# Patient Record
Sex: Male | Born: 1960 | Race: White | Hispanic: No | Smoking: Current every day smoker
Health system: Southern US, Community
[De-identification: ages and names within clinical notes are randomized; demographics above are authoritative.]

## PROBLEM LIST (undated history)

## (undated) DIAGNOSIS — M069 Rheumatoid arthritis, unspecified: Secondary | ICD-10-CM

---

## 2006-04-15 ENCOUNTER — Emergency Department: Payer: Self-pay | Admitting: Emergency Medicine

## 2009-11-25 ENCOUNTER — Emergency Department: Payer: Self-pay | Admitting: Emergency Medicine

## 2014-11-21 ENCOUNTER — Other Ambulatory Visit: Payer: Self-pay

## 2014-11-21 ENCOUNTER — Encounter: Payer: Self-pay | Admitting: Emergency Medicine

## 2014-11-21 ENCOUNTER — Emergency Department
Admission: EM | Admit: 2014-11-21 | Discharge: 2014-11-21 | Disposition: A | Discharge status: Home / Self Care | Payer: BLUE CROSS/BLUE SHIELD | Attending: Emergency Medicine | Admitting: Emergency Medicine

## 2014-11-21 DIAGNOSIS — Z880 Allergy status to penicillin: Secondary | ICD-10-CM | POA: Diagnosis not present

## 2014-11-21 DIAGNOSIS — R319 Hematuria, unspecified: Secondary | ICD-10-CM | POA: Insufficient documentation

## 2014-11-21 DIAGNOSIS — Z87891 Personal history of nicotine dependence: Secondary | ICD-10-CM | POA: Diagnosis not present

## 2014-11-21 DIAGNOSIS — R110 Nausea: Secondary | ICD-10-CM | POA: Diagnosis present

## 2014-11-21 DIAGNOSIS — Z79899 Other long term (current) drug therapy: Secondary | ICD-10-CM | POA: Insufficient documentation

## 2014-11-21 HISTORY — DX: Rheumatoid arthritis, unspecified: M06.9

## 2014-11-21 LAB — URINALYSIS COMPLETE WITH MICROSCOPIC (ARMC ONLY)
BILIRUBIN URINE: NEGATIVE
Bacteria, UA: NONE SEEN
Glucose, UA: NEGATIVE mg/dL
Ketones, ur: NEGATIVE mg/dL
LEUKOCYTES UA: NEGATIVE
NITRITE: NEGATIVE
PROTEIN: NEGATIVE mg/dL
SPECIFIC GRAVITY, URINE: 1.024 (ref 1.005–1.030)
pH: 7 (ref 5.0–8.0)

## 2014-11-21 LAB — HEPATIC FUNCTION PANEL
ALK PHOS: 96 U/L (ref 38–126)
ALT: 14 U/L — ABNORMAL LOW (ref 17–63)
AST: 22 U/L (ref 15–41)
Albumin: 3.1 g/dL — ABNORMAL LOW (ref 3.5–5.0)
Bilirubin, Direct: <0.1 mg/dL — ABNORMAL LOW (ref 0.1–0.5)
Total Bilirubin: 0.3 mg/dL (ref 0.3–1.2)
Total Protein: 7.7 g/dL (ref 6.5–8.1)

## 2014-11-21 LAB — BASIC METABOLIC PANEL
Anion gap: 10 (ref 5–15)
BUN: 13 mg/dL (ref 6–20)
CO2: 22 mmol/L (ref 22–32)
CREATININE: 0.74 mg/dL (ref 0.61–1.24)
Calcium: 9 mg/dL (ref 8.9–10.3)
Chloride: 103 mmol/L (ref 101–111)
GFR calc non Af Amer: >60 mL/min (ref >60)
Glucose, Bld: 117 mg/dL — ABNORMAL HIGH (ref 65–99)
Potassium: 3.6 mmol/L (ref 3.5–5.1)
Sodium: 135 mmol/L (ref 135–145)

## 2014-11-21 LAB — TROPONIN I

## 2014-11-21 LAB — CBC
HCT: 36.2 % — ABNORMAL LOW (ref 40.0–52.0)
HEMOGLOBIN: 11.7 g/dL — AB (ref 13.0–18.0)
MCH: 26.8 pg (ref 26.0–34.0)
MCHC: 32.5 g/dL (ref 32.0–36.0)
MCV: 82.6 fL (ref 80.0–100.0)
PLATELETS: 336 10*3/uL (ref 150–440)
RBC: 4.38 MIL/uL — AB (ref 4.40–5.90)
RDW: 16.2 % — ABNORMAL HIGH (ref 11.5–14.5)
WBC: 6.6 10*3/uL (ref 3.8–10.6)

## 2014-11-21 LAB — CK: Total CK: 44 U/L — ABNORMAL LOW (ref 49–397)

## 2014-11-21 MED ORDER — SODIUM CHLORIDE 0.9 % IV SOLN
Freq: Once | INTRAVENOUS | Status: AC
  Administered 2014-11-21: 16:00:00 via INTRAVENOUS

## 2014-11-21 NOTE — ED Notes (Signed)
Pt comes into ED c/o dizziness and nausea.  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  Denies vomiting, diarrhea, fevers, chills.

## 2014-11-21 NOTE — Discharge Instructions (Signed)
Hematuria Hematuria is blood in your urine. It can be caused by a bladder infection, kidney infection, prostate infection, kidney stone, or cancer of your urinary tract. Infections can usually be treated with medicine, and a kidney stone usually will pass through your urine. If neither of these is the cause of your hematuria, further workup to find out the reason may be needed. It is very important that you tell your health care provider about any blood you see in your urine, even if the blood stops without treatment or happens without causing pain. Blood in your urine that happens and then stops and then happens again can be a symptom of a very serious condition. Also, pain is not a symptom in the initial stages of many urinary cancers. HOME CARE INSTRUCTIONS   Drink lots of fluid, 3-4 quarts a day. If you have been diagnosed with an infection, cranberry juice is especially recommended, in addition to large amounts of water.  Avoid caffeine, tea, and carbonated beverages because they tend to irritate the bladder.  Avoid alcohol because it may irritate the prostate.  Take all medicines as directed by your health care provider.  If you were prescribed an antibiotic medicine, finish it all even if you start to feel better.  If you have been diagnosed with a kidney stone, follow your health care provider's instructions regarding straining your urine to catch the stone.  Empty your bladder often. Avoid holding urine for long periods of time.  After a bowel movement, women should cleanse front to back. Use each tissue only once.  Empty your bladder before and after sexual intercourse if you are a male. SEEK MEDICAL CARE IF:  You develop back pain.  You have a fever.  You have a feeling of sickness in your stomach (nausea) or vomiting.  Your symptoms are not better in 3 days. Return sooner if you are getting worse. SEEK IMMEDIATE MEDICAL CARE IF:   You develop severe vomiting and are  unable to keep the medicine down.  You develop severe back or abdominal pain despite taking your medicines.  You begin passing a large amount of blood or clots in your urine.  You feel extremely weak or faint, or you pass out. MAKE SURE YOU:   Understand these instructions.  Will watch your condition.  Will get help right away if you are not doing well or get worse. Document Released: 04/14/2005 Document Revised: 08/29/2013 Document Reviewed: 12/13/2012 [facility] Patient Information 2015 [place], [place]. This information is not intended to replace advice given to you by your health care provider. Make sure you discuss any questions you have with your health care provider.   THE URINE SPECIMEN SHOWED SOME BLOOD IN IT. PLEASE HAVE YOUR DOCTOR REFER YOU TO A UROLOGIST TO FOLLOW UP ON THAT OR SEE DR WOLFF THE UROLOGIST ON CALL HERE. PLEASE AWSK YOUR DOCTOR IF HE CHECKED YOUR THYROID FUNCTIONS TOO. LAST PLEASE FOLLOW UP WITH THE CARDIOLOGIST DR Welton Flakes IS ON CALL HERE.

## 2014-11-21 NOTE — ED Provider Notes (Signed)
[facility] Emergency Department Provider Note  ____________________________________________  Time seen: Approximately 3:15 PM  I have reviewed the triage vital signs and the nursing notes.   HISTORY  Chief Complaint Dizziness and Nausea    HPI Brendan Mueller is a 54 y.o. 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 He has been feeling lightheaded and nauseated since Saturday. He's been sweating a lot. He's also been having night sweats at home and in the air conditioning. He reports a slight cough. He denies knowing that he has a fever just the sweats at night. Denies any weight loss rash and body aches or pains or any other complaints. He reports several people in his work to come down with heat-related illnesses the last week. Patient has been waiting for several hours now but still really doesn't feel any better than it did when he walked in.  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 He reports they have his gout under good control. He has not had an attack of acute gout for many years.   Past Medical History  Diagnosis Date  . RA (rheumatoid arthritis)     There are no active problems to display for this patient.   History reviewed. No pertinent past surgical history.  Current Outpatient Rx  Name  Route  Sig  Dispense  Refill  . allopurinol (ZYLOPRIM) 300 MG tablet   Oral   Take 1 tablet by mouth daily.      6   . doxepin (SINEQUAN) 10 MG capsule   Oral   Take 1 capsule by mouth as needed.      3   . sertraline (ZOLOFT) 100 MG tablet   Oral   Take 100 mg by mouth daily.         . traMADol (ULTRAM) 50 MG tablet   Oral   Take 50 mg by mouth every 6 (six) hours as needed.         . triamcinolone cream (KENALOG) 0.1 %  Topical   Apply 1 application topically 2 (two) times daily as needed.      2     Allergies Penicillins  No family history on file.  Social History History  Substance Use Topics  . Smoking status: Former Games developer  . Smokeless tobacco: Not on file  . Alcohol Use: No    Review of Systems Constitutional: No fever/chills Eyes: No visual changes. ENT: No sore throat. Cardiovascular: Denies chest pain. Respiratory: Denies shortness of breath. Gastrointestinal: No abdominal pain.  No nausea,   No diarrhea.  No constipation. Genitourinary: Negative for dysuria. Musculoskeletal: Negative for back pain. Skin: Negative for rash. Neurological: Negative for headaches, focal weakness or numbness.  10-point ROS otherwise negative.  ____________________________________________   PHYSICAL EXAM:  VITAL SIGNS: ED Triage Vitals  Enc Vitals Group     BP 11/21/14 1317 139/87 mmHg     Pulse Rate 11/21/14 1317 72     Resp 11/21/14 1317 16     Temp 11/21/14 1317 98 F (36.7 C)     Temp Source 11/21/14 1317 Oral     SpO2 11/21/14 1317 97 %     Weight 11/21/14 1317 127 lb (57.607 kg)     Height 11/21/14  1317 5\' 11"  (1.803 m)     Head Cir --      Peak Flow --      Pain Score --      Pain Loc --      Pain Edu? --      Excl. in GC? --     Constitutional: Alert and oriented. Well appearing and in no acute distress. Eyes: Conjunctivae are normal. PERRL. EOMI. Head: Atraumatic. Nose: No congestion/rhinnorhea. Mouth/Throat: Mucous membranes are moist.  Oropharynx non-erythematous. Neck: No stridor. Cardiovascular: Normal rate, regular rhythm. Grossly normal heart sounds.  Good peripheral circulation. Respiratory: Normal respiratory effort.  No retractions. Lungs CTAB. Gastrointestinal: Soft and nontender. No distention. No abdominal bruits. No CVA tenderness. Musculoskeletal: No lower extremity tenderness nor edema.  No joint effusions. Patient does have getting tophi over his ulna  bilaterally reports also has the same ankles over the Achilles tendon bilaterally stable for him Neurologic:  Normal speech and language. No gross focal neurologic deficits are appreciated. No gait instability. Skin:  Skin is warm, dry and intact. No rash noted. Psychiatric: Mood and affect are normal. Speech and behavior are normal.  ____________________________________________   LABS (all labs ordered are listed, but only abnormal results are displayed)  Labs Reviewed  BASIC METABOLIC PANEL - Abnormal; Notable for the following:    Glucose, Bld 117 (*)    All other components within normal limits  CBC - Abnormal; Notable for the following:    RBC 4.38 (*)    Hemoglobin 11.7 (*)    HCT 36.2 (*)    RDW 16.2 (*)    All other components within normal limits  HEPATIC FUNCTION PANEL - Abnormal; Notable for the following:    Albumin 3.1 (*)    ALT 14 (*)    Bilirubin, Direct <0.1 (*)    All other components within normal limits  URINALYSIS COMPLETEWITH MICROSCOPIC ([facility] ONLY) - Abnormal; Notable for the following:    Color, Urine AMBER (*)    APPearance HAZY (*)    Hgb urine dipstick 2+ (*)    Squamous Epithelial / LPF 0-5 (*)    All other components within normal limits  CK - Abnormal; Notable for the following:    Total CK 44 (*)    All other components within normal limits  TROPONIN I  CBG MONITORING, ED   ____________________________________________  EKG EKG read and interpreted by me shows normal sinus rhythm at a rate of 77 normal axis. There are some T-wave flattening in V5 and slight inversion in V6. T waves in 1 and L and also very flat. ____________________________________________  RADIOLOGY      ____________________________________________   PROCEDURES    ____________________________________________   INITIAL IMPRESSION / ASSESSMENT AND PLAN / ED COURSE  Pertinent labs & imaging results that were available during my care of the patient were reviewed by  me and considered in my medical decision making (see chart for details).   ____________________________________________   FINAL CLINICAL IMPRESSION(S) / ED DIAGNOSES  Final diagnoses:  Nauseated  Hematuria      , MD 11/21/14 1958

## 2014-11-21 NOTE — ED Notes (Addendum)
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

## 2014-12-24 ENCOUNTER — Emergency Department
Admission: EM | Admit: 2014-12-24 | Discharge: 2014-12-24 | Disposition: A | Discharge status: Home / Self Care | Payer: BLUE CROSS/BLUE SHIELD | Attending: Emergency Medicine | Admitting: Emergency Medicine

## 2014-12-24 ENCOUNTER — Emergency Department: Payer: BLUE CROSS/BLUE SHIELD

## 2014-12-24 ENCOUNTER — Encounter: Payer: Self-pay | Admitting: Emergency Medicine

## 2014-12-24 DIAGNOSIS — Z79899 Other long term (current) drug therapy: Secondary | ICD-10-CM | POA: Insufficient documentation

## 2014-12-24 DIAGNOSIS — Y9289 Other specified places as the place of occurrence of the external cause: Secondary | ICD-10-CM | POA: Insufficient documentation

## 2014-12-24 DIAGNOSIS — Z7952 Long term (current) use of systemic steroids: Secondary | ICD-10-CM | POA: Insufficient documentation

## 2014-12-24 DIAGNOSIS — X58XXXA Exposure to other specified factors, initial encounter: Secondary | ICD-10-CM | POA: Insufficient documentation

## 2014-12-24 DIAGNOSIS — Y9389 Activity, other specified: Secondary | ICD-10-CM | POA: Insufficient documentation

## 2014-12-24 DIAGNOSIS — M069 Rheumatoid arthritis, unspecified: Secondary | ICD-10-CM | POA: Insufficient documentation

## 2014-12-24 DIAGNOSIS — Y998 Other external cause status: Secondary | ICD-10-CM | POA: Insufficient documentation

## 2014-12-24 DIAGNOSIS — Z720 Tobacco use: Secondary | ICD-10-CM | POA: Insufficient documentation

## 2014-12-24 DIAGNOSIS — S46912A Strain of unspecified muscle, fascia and tendon at shoulder and upper arm level, left arm, initial encounter: Secondary | ICD-10-CM | POA: Diagnosis not present

## 2014-12-24 DIAGNOSIS — S4992XA Unspecified injury of left shoulder and upper arm, initial encounter: Secondary | ICD-10-CM | POA: Diagnosis present

## 2014-12-24 DIAGNOSIS — Z880 Allergy status to penicillin: Secondary | ICD-10-CM | POA: Diagnosis not present

## 2014-12-24 MED ORDER — MELOXICAM 15 MG PO TABS
15.0000 mg | ORAL_TABLET | Freq: Every day | ORAL | Status: DC
Start: 2014-12-24 — End: 2015-01-21

## 2014-12-24 NOTE — ED Provider Notes (Signed)
[facility] Emergency Department Provider Note  ____________________________________________  Time seen: Approximately 5:10 PM  I have reviewed the triage vital signs and the nursing notes.   HISTORY  Chief Complaint Shoulder Pain   HPI Brendan Mueller is a 54 y.o. male is here complaining of left shoulder pain since Monday (6 days ago). Patient states that he was lifting an item with another person. He did not fall and nothing fell on him. He denies any previous injury to his shoulder. He states he told his boss that he was going to be seen on Friday if his pain continued. He has not taken anything other than tramadol for his pain. He denies any paresthesias. Range of motion increases his pain. He has not tried any ice or heat to the muscle area.   Past Medical History  Diagnosis Date  . RA (rheumatoid arthritis)     There are no active problems to display for this patient.   No past surgical history on file.  Current Outpatient Rx  Name  Route  Sig  Dispense  Refill  . allopurinol (ZYLOPRIM) 300 MG tablet   Oral   Take 1 tablet by mouth daily.      6   . doxepin (SINEQUAN) 10 MG capsule   Oral   Take 1 capsule by mouth as needed.      3   . meloxicam (MOBIC) 15 MG tablet   Oral   Take 1 tablet (15 mg total) by mouth daily.   30 tablet   2   . sertraline (ZOLOFT) 100 MG tablet   Oral   Take 100 mg by mouth daily.         . traMADol (ULTRAM) 50 MG tablet   Oral   Take 50 mg by mouth every 6 (six) hours as needed.         . triamcinolone cream (KENALOG) 0.1 %   Topical   Apply 1 application topically 2 (two) times daily as needed.      2     Allergies Penicillins  History reviewed. No pertinent family history.  Social History Social History  Substance Use Topics  . Smoking status: Current Every Day Smoker  . Smokeless tobacco: None  . Alcohol Use: No    Review of Systems Constitutional: No  fever/chills Cardiovascular: Denies chest pain. Respiratory: Denies shortness of breath. Gastrointestinal: No abdominal pain.  No nausea, no vomiting.  Genitourinary: Negative for dysuria. Musculoskeletal: Positive for right shoulder pain. Positive for rheumatoid arthritis. Skin: Negative for rash. Neurological: Negative for headaches, focal weakness or numbness.  10-point ROS otherwise negative.  ____________________________________________   PHYSICAL EXAM:  VITAL SIGNS: ED Triage Vitals  Enc Vitals Group     BP 12/24/14 1634 124/97 mmHg     Pulse Rate 12/24/14 1634 96     Resp 12/24/14 1634 16     Temp 12/24/14 1634 97.9 F (36.6 C)     Temp Source 12/24/14 1634 Oral     SpO2 12/24/14 1634 98 %     Weight 12/24/14 1634 130 lb (58.968 kg)     Height --      Head Cir --      Peak Flow --      Pain Score 12/24/14 1635 8     Pain Loc --      Pain Edu? --      Excl. in GC? --     Constitutional: Alert and oriented. Well appearing and  in no acute distress. Eyes: Conjunctivae are normal. PERRL. EOMI. Head: Atraumatic. Nose: No congestion/rhinnorhea. Neck: No stridor.  No cervical tenderness on palpation. Range of motion is within normal limits. Cardiovascular: Normal rate, regular rhythm. Grossly normal heart sounds.  Good peripheral circulation. Respiratory: Normal respiratory effort.  No retractions. Lungs CTAB. Gastrointestinal: Soft and nontender. No distention. No abdominal bruits. No CVA tenderness. Musculoskeletal: Left shoulder no gross deformity was noted. Posterior aspect of the left shoulder muscle mass is tender. Range of motion is restricted secondary to pain but no crepitus was noted. No deformity of the clavicle was seen. No lower extremity tenderness nor edema.  No joint effusions. Neurologic:  Normal speech and language. No gross focal neurologic deficits are appreciated. No gait instability. Skin:  Skin is warm, dry and intact. No rash noted. Psychiatric:  Mood and affect are normal. Speech and behavior are normal.  ____________________________________________   LABS (all labs ordered are listed, but only abnormal results are displayed)  Labs Reviewed - No data to display   RADIOLOGY  Left shoulder per radiologist no acute bony abnormality I, Tommi Rumps, personally viewed and evaluated these images (plain radiographs) as part of my medical decision making.  ____________________________________________   PROCEDURES  Procedure(s) performed: None  Critical Care performed: No  ____________________________________________   INITIAL IMPRESSION / ASSESSMENT AND PLAN / ED COURSE  Pertinent labs & imaging results that were available during my care of the patient were reviewed by me and considered in my medical decision making (see chart for details)   Patient was given a prescription for Mobic to be taken once a day. He may continue his tramadol as needed for arthritic pain. He is also to follow-up with his PCP if any continued problems. He was given a note for work stating he has limited use of his left arm for the next 3-4 days.   ____________________________________________   FINAL CLINICAL IMPRESSION(S) / ED DIAGNOSES  Final diagnoses:  Shoulder strain, left, initial encounter      Tommi Rumps, PA-C 12/24/14 1928  Emily Filbert, MD 12/24/14 [phone]

## 2014-12-24 NOTE — ED Notes (Signed)
Pt having pain in left shoulder since Monday.

## 2014-12-24 NOTE — Discharge Instructions (Signed)
Cryotherapy Cryotherapy is when you put ice on your injury. Ice helps lessen pain and puffiness (swelling) after an injury. Ice works the best when you start using it in the first 24 to 48 hours after an injury. HOME CARE  Put a dry or damp towel between the ice pack and your skin.  You may press gently on the ice pack.  Leave the ice on for no more than 10 to 20 minutes at a time.  Check your skin after 5 minutes to make sure your skin is okay.  Rest at least 20 minutes between ice pack uses.  Stop using ice when your skin loses feeling (numbness).  Do not use ice on someone who cannot tell you when it hurts. This includes small children and people with memory problems (dementia). GET HELP RIGHT AWAY IF:  You have white spots on your skin.  Your skin turns blue or pale.  Your skin feels waxy or hard.  Your puffiness gets worse. MAKE SURE YOU:   Understand these instructions.  Will watch your condition.  Will get help right away if you are not doing well or get worse. Document Released: 10/01/2007 Document Revised: 07/07/2011 Document Reviewed: 12/05/2010 [facility] Patient Information 2015 [place], [place]. This information is not intended to replace advice given to you by your health care provider. Make sure you discuss any questions you have with your health care provider.   FOLLOW UP WITH DR. MILLER IF ANY CONTINUED PROBLEMS MOBIC ONCE A DAY WITH FOOD CONTINUE TAKING YOUR MEDICATIONS AS DIRECTED

## 2015-01-21 ENCOUNTER — Emergency Department: Payer: No Typology Code available for payment source

## 2015-01-21 ENCOUNTER — Encounter: Payer: Self-pay | Admitting: Medical Oncology

## 2015-01-21 ENCOUNTER — Emergency Department
Admission: EM | Admit: 2015-01-21 | Discharge: 2015-01-21 | Disposition: A | Discharge status: Home / Self Care | Payer: No Typology Code available for payment source | Attending: Student | Admitting: Student

## 2015-01-21 DIAGNOSIS — Z79899 Other long term (current) drug therapy: Secondary | ICD-10-CM | POA: Diagnosis not present

## 2015-01-21 DIAGNOSIS — Y9389 Activity, other specified: Secondary | ICD-10-CM | POA: Insufficient documentation

## 2015-01-21 DIAGNOSIS — S161XXA Strain of muscle, fascia and tendon at neck level, initial encounter: Secondary | ICD-10-CM | POA: Diagnosis not present

## 2015-01-21 DIAGNOSIS — S29012A Strain of muscle and tendon of back wall of thorax, initial encounter: Secondary | ICD-10-CM | POA: Insufficient documentation

## 2015-01-21 DIAGNOSIS — S39012A Strain of muscle, fascia and tendon of lower back, initial encounter: Secondary | ICD-10-CM | POA: Insufficient documentation

## 2015-01-21 DIAGNOSIS — S29019A Strain of muscle and tendon of unspecified wall of thorax, initial encounter: Secondary | ICD-10-CM

## 2015-01-21 DIAGNOSIS — Y92410 Unspecified street and highway as the place of occurrence of the external cause: Secondary | ICD-10-CM | POA: Diagnosis not present

## 2015-01-21 DIAGNOSIS — Z720 Tobacco use: Secondary | ICD-10-CM | POA: Diagnosis not present

## 2015-01-21 DIAGNOSIS — Y998 Other external cause status: Secondary | ICD-10-CM | POA: Insufficient documentation

## 2015-01-21 DIAGNOSIS — Z880 Allergy status to penicillin: Secondary | ICD-10-CM | POA: Diagnosis not present

## 2015-01-21 DIAGNOSIS — S3992XA Unspecified injury of lower back, initial encounter: Secondary | ICD-10-CM | POA: Diagnosis present

## 2015-01-21 MED ORDER — MELOXICAM 15 MG PO TABS: 15.0000 mg | ORAL_TABLET | Freq: Every day | ORAL | Status: AC

## 2015-01-21 MED ORDER — HYDROCODONE-ACETAMINOPHEN 5-325 MG PO TABS: 1.0000 | ORAL_TABLET | ORAL | Status: AC | PRN
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
## 2017-12-27 DEATH — deceased
